# Patient Record
Sex: Female | Born: 1955 | Race: White | Hispanic: No | Marital: Single | Smoking: Former smoker
Health system: Southern US, Community
[De-identification: ages and names within clinical notes are randomized; demographics above are authoritative.]

## PROBLEM LIST (undated history)

## (undated) DIAGNOSIS — I10 Essential (primary) hypertension: Secondary | ICD-10-CM

## (undated) DIAGNOSIS — E669 Obesity, unspecified: Secondary | ICD-10-CM

## (undated) DIAGNOSIS — E119 Type 2 diabetes mellitus without complications: Secondary | ICD-10-CM

## (undated) DIAGNOSIS — E7800 Pure hypercholesterolemia, unspecified: Secondary | ICD-10-CM

## (undated) HISTORY — PX: KNEE SURGERY: SHX244

## (undated) HISTORY — PX: HEMORRHOID SURGERY: SHX153

## (undated) HISTORY — PX: OTHER SURGICAL HISTORY: SHX169

## (undated) HISTORY — PX: ANKLE SURGERY: SHX546

## (undated) HISTORY — PX: APPENDECTOMY: SHX54

---

## 1998-02-24 ENCOUNTER — Ambulatory Visit (HOSPITAL_COMMUNITY): Admission: RE | Admit: 1998-02-24 | Discharge: 1998-02-24 | Payer: Self-pay

## 1998-04-05 ENCOUNTER — Emergency Department (HOSPITAL_COMMUNITY): Admission: EM | Admit: 1998-04-05 | Discharge: 1998-04-05 | Payer: Self-pay | Admitting: Emergency Medicine

## 1998-04-09 ENCOUNTER — Emergency Department (HOSPITAL_COMMUNITY): Admission: EM | Admit: 1998-04-09 | Discharge: 1998-04-10 | Payer: Self-pay | Admitting: Emergency Medicine

## 1998-04-16 ENCOUNTER — Encounter: Admission: RE | Admit: 1998-04-16 | Discharge: 1998-04-16 | Payer: Self-pay | Admitting: Hematology and Oncology

## 1998-04-20 ENCOUNTER — Ambulatory Visit (HOSPITAL_COMMUNITY): Admission: RE | Admit: 1998-04-20 | Discharge: 1998-04-20 | Payer: Self-pay | Admitting: *Deleted

## 1998-04-21 ENCOUNTER — Encounter: Admission: RE | Admit: 1998-04-21 | Discharge: 1998-04-21 | Payer: Self-pay | Admitting: Internal Medicine

## 1998-05-06 ENCOUNTER — Ambulatory Visit (HOSPITAL_COMMUNITY): Admission: RE | Admit: 1998-05-06 | Discharge: 1998-05-06 | Payer: Self-pay | Admitting: *Deleted

## 1998-05-12 ENCOUNTER — Encounter: Admission: RE | Admit: 1998-05-12 | Discharge: 1998-05-12 | Payer: Self-pay | Admitting: Hematology and Oncology

## 1998-05-21 ENCOUNTER — Ambulatory Visit (HOSPITAL_COMMUNITY): Admission: RE | Admit: 1998-05-21 | Discharge: 1998-05-21 | Payer: Self-pay | Admitting: *Deleted

## 1998-05-26 ENCOUNTER — Encounter: Admission: RE | Admit: 1998-05-26 | Discharge: 1998-08-24 | Payer: Self-pay | Admitting: *Deleted

## 1998-05-27 ENCOUNTER — Encounter: Admission: RE | Admit: 1998-05-27 | Discharge: 1998-05-27 | Payer: Self-pay | Admitting: Hematology and Oncology

## 1998-06-10 ENCOUNTER — Encounter: Admission: RE | Admit: 1998-06-10 | Discharge: 1998-06-10 | Payer: Self-pay | Admitting: Hematology and Oncology

## 1998-06-23 ENCOUNTER — Emergency Department (HOSPITAL_COMMUNITY): Admission: EM | Admit: 1998-06-23 | Discharge: 1998-06-23 | Payer: Self-pay | Admitting: Emergency Medicine

## 1998-07-14 ENCOUNTER — Inpatient Hospital Stay (HOSPITAL_COMMUNITY): Admission: EM | Admit: 1998-07-14 | Discharge: 1998-07-17 | Payer: Self-pay | Admitting: Emergency Medicine

## 1998-07-14 ENCOUNTER — Encounter: Payer: Self-pay | Admitting: Emergency Medicine

## 1998-07-15 ENCOUNTER — Encounter: Payer: Self-pay | Admitting: Internal Medicine

## 1998-07-28 ENCOUNTER — Encounter: Admission: RE | Admit: 1998-07-28 | Discharge: 1998-07-28 | Payer: Self-pay | Admitting: Internal Medicine

## 1998-09-05 ENCOUNTER — Ambulatory Visit (HOSPITAL_COMMUNITY): Admission: RE | Admit: 1998-09-05 | Discharge: 1998-09-05 | Payer: Self-pay | Admitting: Internal Medicine

## 1998-09-05 ENCOUNTER — Encounter: Admission: RE | Admit: 1998-09-05 | Discharge: 1998-09-05 | Payer: Self-pay | Admitting: Internal Medicine

## 1998-09-29 ENCOUNTER — Encounter: Admission: RE | Admit: 1998-09-29 | Discharge: 1998-09-29 | Payer: Self-pay | Admitting: Internal Medicine

## 1998-10-13 ENCOUNTER — Ambulatory Visit (HOSPITAL_COMMUNITY): Admission: RE | Admit: 1998-10-13 | Discharge: 1998-10-13 | Payer: Self-pay

## 1998-10-29 ENCOUNTER — Encounter: Admission: RE | Admit: 1998-10-29 | Discharge: 1998-10-29 | Payer: Self-pay | Admitting: Internal Medicine

## 1998-11-17 ENCOUNTER — Encounter: Admission: RE | Admit: 1998-11-17 | Discharge: 1998-11-17 | Payer: Self-pay | Admitting: Internal Medicine

## 1998-12-08 ENCOUNTER — Encounter: Payer: Self-pay | Admitting: Emergency Medicine

## 1998-12-08 ENCOUNTER — Emergency Department (HOSPITAL_COMMUNITY): Admission: EM | Admit: 1998-12-08 | Discharge: 1998-12-08 | Payer: Self-pay | Admitting: Emergency Medicine

## 1998-12-30 ENCOUNTER — Inpatient Hospital Stay (HOSPITAL_COMMUNITY): Admission: EM | Admit: 1998-12-30 | Discharge: 1999-01-01 | Payer: Self-pay | Admitting: Emergency Medicine

## 1998-12-30 ENCOUNTER — Encounter: Payer: Self-pay | Admitting: Emergency Medicine

## 1998-12-31 ENCOUNTER — Encounter: Payer: Self-pay | Admitting: Internal Medicine

## 1999-01-12 ENCOUNTER — Encounter: Admission: RE | Admit: 1999-01-12 | Discharge: 1999-01-12 | Payer: Self-pay | Admitting: Internal Medicine

## 1999-03-24 ENCOUNTER — Encounter: Admission: RE | Admit: 1999-03-24 | Discharge: 1999-03-24 | Payer: Self-pay | Admitting: Internal Medicine

## 1999-05-19 ENCOUNTER — Encounter: Admission: RE | Admit: 1999-05-19 | Discharge: 1999-05-19 | Payer: Self-pay | Admitting: Internal Medicine

## 1999-06-21 ENCOUNTER — Encounter: Payer: Self-pay | Admitting: Emergency Medicine

## 1999-06-21 ENCOUNTER — Emergency Department (HOSPITAL_COMMUNITY): Admission: EM | Admit: 1999-06-21 | Discharge: 1999-06-21 | Payer: Self-pay | Admitting: Emergency Medicine

## 1999-06-24 ENCOUNTER — Encounter: Admission: RE | Admit: 1999-06-24 | Discharge: 1999-06-24 | Payer: Self-pay | Admitting: Internal Medicine

## 1999-07-01 ENCOUNTER — Emergency Department (HOSPITAL_COMMUNITY): Admission: EM | Admit: 1999-07-01 | Discharge: 1999-07-02 | Payer: Self-pay | Admitting: Emergency Medicine

## 1999-07-02 ENCOUNTER — Encounter: Payer: Self-pay | Admitting: Emergency Medicine

## 1999-07-28 ENCOUNTER — Emergency Department (HOSPITAL_COMMUNITY): Admission: EM | Admit: 1999-07-28 | Discharge: 1999-07-28 | Payer: Self-pay | Admitting: Emergency Medicine

## 1999-12-15 ENCOUNTER — Emergency Department (HOSPITAL_COMMUNITY): Admission: EM | Admit: 1999-12-15 | Discharge: 1999-12-15 | Payer: Self-pay

## 1999-12-23 ENCOUNTER — Encounter: Admission: RE | Admit: 1999-12-23 | Discharge: 1999-12-23 | Payer: Self-pay | Admitting: Internal Medicine

## 2000-02-21 ENCOUNTER — Emergency Department (HOSPITAL_COMMUNITY): Admission: EM | Admit: 2000-02-21 | Discharge: 2000-02-21 | Payer: Self-pay | Admitting: Emergency Medicine

## 2000-02-23 ENCOUNTER — Emergency Department (HOSPITAL_COMMUNITY): Admission: EM | Admit: 2000-02-23 | Discharge: 2000-02-23 | Payer: Self-pay | Admitting: *Deleted

## 2000-03-03 ENCOUNTER — Encounter: Admission: RE | Admit: 2000-03-03 | Discharge: 2000-03-03 | Payer: Self-pay | Admitting: Internal Medicine

## 2000-03-17 ENCOUNTER — Encounter: Admission: RE | Admit: 2000-03-17 | Discharge: 2000-03-17 | Payer: Self-pay | Admitting: Hematology and Oncology

## 2000-03-20 ENCOUNTER — Encounter: Payer: Self-pay | Admitting: Emergency Medicine

## 2000-03-20 ENCOUNTER — Inpatient Hospital Stay (HOSPITAL_COMMUNITY): Admission: EM | Admit: 2000-03-20 | Discharge: 2000-03-21 | Payer: Self-pay | Admitting: Emergency Medicine

## 2000-03-23 ENCOUNTER — Encounter: Admission: RE | Admit: 2000-03-23 | Discharge: 2000-03-23 | Payer: Self-pay | Admitting: Internal Medicine

## 2000-03-25 ENCOUNTER — Encounter: Payer: Self-pay | Admitting: *Deleted

## 2000-03-25 ENCOUNTER — Ambulatory Visit (HOSPITAL_COMMUNITY): Admission: RE | Admit: 2000-03-25 | Discharge: 2000-03-25 | Payer: Self-pay | Admitting: *Deleted

## 2000-04-05 ENCOUNTER — Encounter: Admission: RE | Admit: 2000-04-05 | Discharge: 2000-04-05 | Payer: Self-pay | Admitting: Internal Medicine

## 2000-04-12 ENCOUNTER — Emergency Department (HOSPITAL_COMMUNITY): Admission: EM | Admit: 2000-04-12 | Discharge: 2000-04-13 | Payer: Self-pay | Admitting: Emergency Medicine

## 2000-04-15 ENCOUNTER — Encounter: Admission: RE | Admit: 2000-04-15 | Discharge: 2000-04-15 | Payer: Self-pay | Admitting: Internal Medicine

## 2000-05-02 ENCOUNTER — Encounter: Admission: RE | Admit: 2000-05-02 | Discharge: 2000-05-02 | Payer: Self-pay

## 2000-05-07 ENCOUNTER — Inpatient Hospital Stay (HOSPITAL_COMMUNITY): Admission: EM | Admit: 2000-05-07 | Discharge: 2000-05-08 | Payer: Self-pay | Admitting: Emergency Medicine

## 2000-05-07 ENCOUNTER — Encounter: Payer: Self-pay | Admitting: Emergency Medicine

## 2000-05-30 ENCOUNTER — Encounter: Admission: RE | Admit: 2000-05-30 | Discharge: 2000-05-30 | Payer: Self-pay | Admitting: Internal Medicine

## 2000-12-06 ENCOUNTER — Emergency Department (HOSPITAL_COMMUNITY): Admission: EM | Admit: 2000-12-06 | Discharge: 2000-12-06 | Payer: Self-pay | Admitting: Emergency Medicine

## 2000-12-06 ENCOUNTER — Encounter: Payer: Self-pay | Admitting: Emergency Medicine

## 2001-02-28 ENCOUNTER — Emergency Department (HOSPITAL_COMMUNITY): Admission: EM | Admit: 2001-02-28 | Discharge: 2001-02-28 | Payer: Self-pay | Admitting: Unknown Physician Specialty

## 2001-05-10 ENCOUNTER — Encounter: Payer: Self-pay | Admitting: Emergency Medicine

## 2001-05-10 ENCOUNTER — Emergency Department (HOSPITAL_COMMUNITY): Admission: EM | Admit: 2001-05-10 | Discharge: 2001-05-10 | Payer: Self-pay | Admitting: Emergency Medicine

## 2001-06-15 ENCOUNTER — Encounter: Admission: RE | Admit: 2001-06-15 | Discharge: 2001-06-15 | Payer: Self-pay | Admitting: Internal Medicine

## 2001-06-20 ENCOUNTER — Ambulatory Visit: Admission: RE | Admit: 2001-06-20 | Discharge: 2001-06-20 | Payer: Self-pay | Admitting: *Deleted

## 2001-06-23 ENCOUNTER — Encounter: Admission: RE | Admit: 2001-06-23 | Discharge: 2001-06-23 | Payer: Self-pay | Admitting: Internal Medicine

## 2001-07-05 ENCOUNTER — Ambulatory Visit (HOSPITAL_COMMUNITY): Admission: RE | Admit: 2001-07-05 | Discharge: 2001-07-05 | Payer: Self-pay | Admitting: Internal Medicine

## 2001-11-07 ENCOUNTER — Encounter: Admission: RE | Admit: 2001-11-07 | Discharge: 2001-11-07 | Payer: Self-pay

## 2001-12-12 ENCOUNTER — Encounter: Admission: RE | Admit: 2001-12-12 | Discharge: 2001-12-12 | Payer: Self-pay | Admitting: *Deleted

## 2001-12-12 ENCOUNTER — Other Ambulatory Visit: Admission: RE | Admit: 2001-12-12 | Discharge: 2001-12-12 | Payer: Self-pay | Admitting: *Deleted

## 2001-12-13 ENCOUNTER — Encounter: Admission: RE | Admit: 2001-12-13 | Discharge: 2001-12-13 | Payer: Self-pay

## 2002-01-24 ENCOUNTER — Encounter: Admission: RE | Admit: 2002-01-24 | Discharge: 2002-01-24 | Payer: Self-pay | Admitting: Internal Medicine

## 2002-01-29 ENCOUNTER — Encounter: Payer: Self-pay | Admitting: Internal Medicine

## 2002-01-29 ENCOUNTER — Encounter: Admission: RE | Admit: 2002-01-29 | Discharge: 2002-01-29 | Payer: Self-pay | Admitting: *Deleted

## 2002-02-11 ENCOUNTER — Inpatient Hospital Stay (HOSPITAL_COMMUNITY): Admission: EM | Admit: 2002-02-11 | Discharge: 2002-02-13 | Payer: Self-pay | Admitting: Cardiovascular Disease

## 2002-02-11 ENCOUNTER — Encounter: Payer: Self-pay | Admitting: Emergency Medicine

## 2002-02-12 ENCOUNTER — Encounter: Payer: Self-pay | Admitting: Cardiovascular Disease

## 2002-03-20 ENCOUNTER — Encounter: Admission: RE | Admit: 2002-03-20 | Discharge: 2002-03-20 | Payer: Self-pay | Admitting: Internal Medicine

## 2002-03-26 ENCOUNTER — Encounter: Payer: Self-pay | Admitting: Internal Medicine

## 2002-03-26 ENCOUNTER — Ambulatory Visit (HOSPITAL_COMMUNITY): Admission: RE | Admit: 2002-03-26 | Discharge: 2002-03-26 | Payer: Self-pay | Admitting: Internal Medicine

## 2002-04-07 ENCOUNTER — Emergency Department (HOSPITAL_COMMUNITY): Admission: EM | Admit: 2002-04-07 | Discharge: 2002-04-07 | Payer: Self-pay | Admitting: Emergency Medicine

## 2002-04-07 ENCOUNTER — Encounter: Payer: Self-pay | Admitting: Emergency Medicine

## 2002-04-17 ENCOUNTER — Encounter: Admission: RE | Admit: 2002-04-17 | Discharge: 2002-04-17 | Payer: Self-pay | Admitting: Orthopedic Surgery

## 2002-04-17 ENCOUNTER — Encounter: Payer: Self-pay | Admitting: Orthopedic Surgery

## 2002-04-20 ENCOUNTER — Encounter: Admission: RE | Admit: 2002-04-20 | Discharge: 2002-04-20 | Payer: Self-pay | Admitting: Internal Medicine

## 2002-05-11 ENCOUNTER — Ambulatory Visit (HOSPITAL_COMMUNITY): Admission: RE | Admit: 2002-05-11 | Discharge: 2002-05-11 | Payer: Self-pay | Admitting: Internal Medicine

## 2002-05-11 ENCOUNTER — Encounter: Admission: RE | Admit: 2002-05-11 | Discharge: 2002-05-11 | Payer: Self-pay | Admitting: Internal Medicine

## 2002-05-11 ENCOUNTER — Encounter: Payer: Self-pay | Admitting: Internal Medicine

## 2002-05-24 ENCOUNTER — Ambulatory Visit (HOSPITAL_BASED_OUTPATIENT_CLINIC_OR_DEPARTMENT_OTHER): Admission: RE | Admit: 2002-05-24 | Discharge: 2002-05-24 | Payer: Self-pay | Admitting: *Deleted

## 2002-05-30 ENCOUNTER — Emergency Department (HOSPITAL_COMMUNITY): Admission: EM | Admit: 2002-05-30 | Discharge: 2002-05-30 | Payer: Self-pay | Admitting: Emergency Medicine

## 2002-06-01 ENCOUNTER — Encounter: Admission: RE | Admit: 2002-06-01 | Discharge: 2002-06-01 | Payer: Self-pay | Admitting: Internal Medicine

## 2002-06-15 ENCOUNTER — Encounter: Admission: RE | Admit: 2002-06-15 | Discharge: 2002-06-15 | Payer: Self-pay | Admitting: Internal Medicine

## 2002-07-08 ENCOUNTER — Encounter: Payer: Self-pay | Admitting: Emergency Medicine

## 2002-07-08 ENCOUNTER — Emergency Department (HOSPITAL_COMMUNITY): Admission: EM | Admit: 2002-07-08 | Discharge: 2002-07-08 | Payer: Self-pay | Admitting: Emergency Medicine

## 2002-07-27 ENCOUNTER — Ambulatory Visit (HOSPITAL_COMMUNITY): Admission: RE | Admit: 2002-07-27 | Discharge: 2002-07-27 | Payer: Self-pay | Admitting: *Deleted

## 2002-07-27 ENCOUNTER — Encounter: Admission: RE | Admit: 2002-07-27 | Discharge: 2002-07-27 | Payer: Self-pay | Admitting: Internal Medicine

## 2002-07-27 ENCOUNTER — Encounter: Payer: Self-pay | Admitting: *Deleted

## 2002-08-27 ENCOUNTER — Encounter: Admission: RE | Admit: 2002-08-27 | Discharge: 2002-08-27 | Payer: Self-pay | Admitting: Internal Medicine

## 2002-08-27 ENCOUNTER — Ambulatory Visit (HOSPITAL_COMMUNITY): Admission: RE | Admit: 2002-08-27 | Discharge: 2002-08-27 | Payer: Self-pay | Admitting: Internal Medicine

## 2002-09-11 ENCOUNTER — Encounter: Admission: RE | Admit: 2002-09-11 | Discharge: 2002-12-10 | Payer: Self-pay | Admitting: *Deleted

## 2002-09-21 ENCOUNTER — Encounter: Admission: RE | Admit: 2002-09-21 | Discharge: 2002-09-21 | Payer: Self-pay | Admitting: Internal Medicine

## 2002-11-14 ENCOUNTER — Encounter: Admission: RE | Admit: 2002-11-14 | Discharge: 2002-11-14 | Payer: Self-pay | Admitting: Internal Medicine

## 2003-01-23 ENCOUNTER — Encounter: Admission: RE | Admit: 2003-01-23 | Discharge: 2003-01-23 | Payer: Self-pay | Admitting: Internal Medicine

## 2003-04-17 ENCOUNTER — Encounter: Admission: RE | Admit: 2003-04-17 | Discharge: 2003-04-17 | Payer: Self-pay | Admitting: Internal Medicine

## 2007-06-08 ENCOUNTER — Inpatient Hospital Stay (HOSPITAL_COMMUNITY): Admission: EM | Admit: 2007-06-08 | Discharge: 2007-06-09 | Payer: Self-pay | Admitting: Emergency Medicine

## 2007-06-08 ENCOUNTER — Ambulatory Visit: Payer: Self-pay | Admitting: Cardiology

## 2007-06-29 ENCOUNTER — Emergency Department (HOSPITAL_COMMUNITY): Admission: EM | Admit: 2007-06-29 | Discharge: 2007-06-29 | Payer: Self-pay | Admitting: Emergency Medicine

## 2011-03-16 NOTE — Cardiovascular Report (Signed)
NAME:  [name] [name], [name] [name] [name]                  ACCOUNT NO.:  [id number]   MEDICAL RECORD NO.:  [id number]          PATIENT TYPE:  INP   LOCATION:  3731                         FACILITY:  [facility]   PHYSICIAN:  [name] Buckles. [name] [name], MDDATE OF BIRTH:  10/25/1956   DATE OF PROCEDURE:  06/08/2007  DATE OF DISCHARGE:                            CARDIAC CATHETERIZATION   PRIMARY CARE PHYSICIAN:  Dr. Sung [name] [name].   PATIENT IDENTIFICATION:  The patient is a very pleasant, 55 year old  woman with multiple medical problems including hypertension, morbid  obesity, fibromyalgia. She had a cardiac catheterization about 10 years  ago which showed minimal coronary artery disease. She was admitted with  chest pain concerning for unstable angina. EKG was unremarkable. Cardiac  enzymes were normal. She was brought to the catheterization lab.   PROCEDURES PERFORMED:  1. Selective coronary angiography.  2. Left heart catheterization.  3. Left ventriculogram.   DESCRIPTION OF PROCEDURE:  The risks and indications of the procedures  were explained; consent was signed and placed on the chart.   A 5-French arterial sheath was placed in the right femoral artery.  Standard catheters, including a JL4, JR4 and angled pigtail were used  for the procedure. All catheter exchanges were made over wire. There  were no apparent complications.   FINDINGS:  1. Central aortic pressure 123/79 with a mean of 98.  2. LV pressure is 116/11 with an EDP of 31. There was no aortic      stenosis.  3. Left main was normal.  4. LAD was long vessel coursing through the apex. It gave off a large      first diagonal and a large septal perforator. There were minimal      luminal irregularities in the mid section.  5. Left circumflex was made up of a tiny OM1 and a large branching OM2      and a small OM3. There was minor irregularity in the OM3.  6. Right coronary artery was a dominant vessel. It gave off a large      acute marginal which fed the  PDA territory. There was a small      posterior lateral. It was angiographically normal.  7. Left ventriculogram done in the RAO position showed an EF of 60%      with no focal wall motion abnormalities.   ASSESSMENT:  1. Essentially normal coronary arteries.  2. Normal left ventricular function with elevated left ventricular end-      diastolic pressure, consistent with diastolic dysfunction.   PLAN:  This is probably noncardiac chest pain. Will check a D-dimer. I  suspect she will be suitable for discharge home; consider a proton pump  inhibitor for possible reflux.      [name] Buckles. Bensimhon, MD  Electronically Signed     DRB/MEDQ  D:  06/08/2007  T:  06/08/2007  Job:  [id number]   cc:   [name] [name] [name] [name] [name], Dr.

## 2011-03-16 NOTE — Discharge Summary (Signed)
NAME:  [name] [name], [name] [name]                  ACCOUNT NO.:  [id number]   MEDICAL RECORD NO.:  [id number]          PATIENT TYPE:  INP   LOCATION:  3731                         FACILITY:  [facility]   PHYSICIAN:  Bevelyn Buckles. Bensimhon, MDDATE OF BIRTH:  26-Apr-1956   DATE OF ADMISSION:  06/08/2007  DATE OF DISCHARGE:  06/09/2007                               DISCHARGE SUMMARY   PRIMARY CARDIOLOGIST:  Dr. Arvilla Meres.   PRIMARY CARE PHYSICIAN:  Dr. Melven Sartorius at [facility] in [place].   PROCEDURES PERFORMED DURING HOSPITALIZATION:  Cardiac catheterization on  June 08, 2007 by Dr. Arvilla Meres revealing normal LV function with  elevated LV EDP.  Essentially normal coronaries.  Diastolic dysfunction.   DISCHARGE DIAGNOSES:  1. Noncardiac chest pain.  2. History of hypertension.  3. Hypercholesterolemia.  4. Depression.  5. History of asthma.  6. Hypothyroidism.  7. Fibromyalgia.  8. Obstructive sleep apnea, does not use CPAP.   HOSPITAL COURSE:  This is a 55 year old female who was admitted to our  service on June 08, 2007 as a new patient with complaints of chest  tightness, which she rates a 10, describes also with associated  dizziness and diaphoresis after she went to bed.  The chest pain woke  her up and is now feeling tightness and having trouble taking deep  breaths.  The patient tried nebulizer treatments at home and had no  relief.  She called 9-1-1 and her daughter found her lying on the floor,  uncertain if she had a syncopal episode versus lying down.  The patient  was found to have a blood pressure of 190-210 systolically.  She was  given nitroglycerin, aspirin and morphine and transferred to [facility]  Emergency Room.   The patient was seen and examined by Dr. Arvilla Meres in the  emergency room.  The patient was admitted with unstable angina and  started on heparin, nitroglycerin and aspirin.  A cardiac  catheterization was scheduled the day of admission.  The  patient  underwent cardiac catheterization by Dr. Arvilla Meres as described  above.  The patient tolerated the procedure well without evidence of  hematoma, bleeding or infection at the site.  The patient continued to  have some mild chest discomfort, but it was found not to be cardiac in  origin.  Her D-dimer was negative.  She had been ruled out for  myocardial infarction.  The patient was felt this was more  musculoskeletal.  The patient was also started on a proton pump  inhibitor, Protonix 40 mg b.i.d. and advised to return home today and  follow up with her primary care physician.   LABS ON DISCHARGE:  Sodium 141, potassium 4.8, chloride 106, CO2 31,  glucose 103, BUN 11, creatinine 0.77.  Hemoglobin 11.0, hematocrit 32.1,  white blood cell is 5.7, platelets 205.  Cardiac enzymes were found to  be negative with a troponin of 0.01, 0.02 and less than 0.05  respectively.  TSH 4.467.  D-dimer negative at 0.26.  Hemoglobin A1c  6.0.  Chest x-ray revealed mild pulmonary vascular congestion.  EKG  revealed normal sinus rhythm with occasional PVCs and right bundle  branch block noted.   DISCHARGE MEDICATIONS:  1. Protonix 40 mg twice a day.  2. Lipitor 80 mg daily.  3. Metoprolol 100 mg daily.  4. Levothroid 150 mcg daily.  5. Cymbalta 60 mg daily.   ALLERGIES:  NO KNOWN DRUG ALLERGIES.   FOLLOWUP PLANS AND APPOINTMENTS:  1. The patient will follow up with physician assistant with Dr. Arvilla Meres on September 3 at 10:15 a.m.  2. The patient has been given post cardiac catheterization      instructions with particular emphasis on the right groin site for      evidence of hematoma, bleeding, infection or severe pain.  3. The patient will follow with Dr. Melven Sartorius through [facility] in      [place] for continued medical management.   Time spent with the patient, to include physician time, 45 minutes.      Bettey Mare. Lyman Bishop, NP      Bevelyn Buckles. Bensimhon,  MD  Electronically Signed    KML/MEDQ  D:  06/09/2007  T:  06/10/2007  Job:  [id number]   cc:   Melven Sartorius, M.D.

## 2011-03-16 NOTE — H&P (Signed)
NAME:  [name] [name], [name] [name]                  ACCOUNT NO.:  [id number]   MEDICAL RECORD NO.:  [id number]          PATIENT TYPE:  INP   LOCATION:  1823                         FACILITY:  [facility]   PHYSICIAN:  Bevelyn Buckles. Bensimhon, MDDATE OF BIRTH:  October 12, 1956   DATE OF ADMISSION:  06/08/2007  DATE OF DISCHARGE:                              HISTORY & PHYSICAL   CARDIOLOGIST:  New, being seen by Dr. Gala Romney.  The patient states she  saw [facility] approximately 10 years ago.   PRIMARY CARE DOCTOR:  Dr. Sung Amabile, at [facility] in [place], [place]  [place].   HISTORY OF PRESENT ILLNESS:  [name] is a pleasant 55 year old  Caucasian female with known history of coronary artery disease.  The  patient reports she underwent a cardiac catheterization in 1998 here at  [facility] prior to moving to [place].  She reports she was told  she had a 30-40% blockage in one of her vessels.  Does not recall which  one.  She denies any previous interventions.  She states she also had a  stress test at that time and echocardiogram.  She reports that workup  was done secondary to episode of chest discomfort similar to what she is  having today.  She currently lives in [place], [place].  She is  here caring for her grandchildren while her daughter is in the hospital.  She states she has been in her usual state of health until last night.  She had gone to bed  She was ver tired from caring for the  grandchildren.  She states chest discomfort woke her up approximately  around midnight.  She describes it as a heavy tight sensation in her  sternal area.  She was unable to take a deep breath.  She has a history  of asthma, so she tried her nebulizer with absolutely no relief.  She  became very diaphoretic, dizzy, had diarrhea.  She called her daughter  who called 9-1-1.  Apparently, at that point, history is somewhat vague.  Daughter states when she got there, she found her mother leaning into  the door and was unable a new open the door.  When she did get the door  open her mother fell into her arms.  [name] cannot really recall what  happened around that time.  She remembers walking from the bedroom into  the living room.  Does not recall anything after that until EMS had her  on the stretcher.  She states her chest discomfort was a 10 on a scale  of 1-10.  When EMS got there, she was diaphoretic, had an episode of  diarrhea, dizzy.  Her blood pressure was 190-210 systolic.  The patient  was treated with nitroglycerin and aspirin en route with morphine.  She  states her chest discomfort eased off to 6 or 7.  The patient also  states she has forgotten to take her blood pressure medicines for 3  days.  She forgot to bring them with her from [place].  She has  been up here.  EKG showed normal sinus rhythm with a right bundle branch  block.  The patient states she was told in the past she had an abnormal  EKG.  Currently, she is rating her pain around a 5.  She does not have  any nitroglycerin or heparin infusing at this time.   ALLERGIES:  NO KNOWN DRUG ALLERGIES.   MEDICATIONS:  1. Cymbalta.  2. Metoprolol 100.  3. Zantac.  4. Nexium.  5. Synthroid 150 mcg.  6. Statin which is new.  She states she just got the prescription for      it, she does not know what it is.  7. Cymbalta 60 mg daily.  8. The patient states she was previously on aspirin.  However,      approximately 6 months ago, she had an episode of a questionable GI      bleed.  Had a colonoscopy, was told she had diverticulitis.  Her      aspirin was discontinued at that time.   PAST MEDICAL HISTORY:  1. Hypertension.  2. Hypercholesteremia.  3. Depression.  4. Asthma.  5. Hypothyroidism.  6. Fibromyalgia.  7. Rheumatoid arthritis with intolerance to RA medicines.  8. Questionable TIA in the past.  9. Questionable diagnosis of CHF in 1999.  10.Obstructive sleep apnea.  The patient not using her  CPAP yet.  She      states she has not been fitted with it because she has not been      back to the doctor for further evaluation after sleep study.  11.Coronary artery disease supposedly has single vessel disease.  The      patient cannot recall if there were other blockages or not.  Cath      report from 1998 pending follow-up of documentation.  Apparently      had an echo and a stress test also at that time.  12.Hemorrhoid surgery.  13.Tonsillectomy.  14.Appendectomy.  15.Questionable GI bleed/diverticulitis status post colonoscopy      approximately 6 months ago with aspirin discontinued at that time.   SOCIAL HISTORY:  She lives in [place], [place] with her boyfriend.  She is disabled.  She states her disability is secondary to chronic pain  in her lower extremities with unclear diagnosis.  She has adult  children.  Tobacco she quit 15 years ago.  Denies any EtOH, drug or  herbal medication use.  No diet restrictions.  Mother deceased at age 8  secondary to acute CVA.  Father is alive at age 52.  She has been  estranged from him, does not know his medical history.  She has multiple  siblings with known coronary artery disease status post MI status post  stent.  She has a sister with cardiomyopathy awaiting a transplant   REVIEW OF SYSTEMS:  Positive for sweats, headache, chest pain, shortness  of breath, dyspnea on exertion, peripheral edema, questionable syncopal  episode today as stated above, myalgia, arthralgia pain in shoulders and  back secondary to fibromyalgia and episode of diarrhea this a.m.   PHYSICAL EXAMINATION:  VITAL SIGNS:  Temperature 97.4, pulse 60,  respirations 20, blood pressure currently 137/77.  The patient's sat 90,  6% of 2 liters nasal cannula.  She is currently rating her chest pain  around 5 substernal discomfort.  HEENT:  Normocephalic, atraumatic.  Pupils equal, round, reactive to  light.  Mucous membrane is moist.  NECK:  Supple without  lymphadenopathy, no bruits.  No JVD.  CARDIOVASCULAR:  Somewhat distant heart sounds.  Appears to be S1-S2  regular rate and rhythm.  I do not hear any murmurs, rubs or gallops.  LUNGS:  Clear to auscultation with poor inspiratory effort.  SKIN:  Warm and dry.  ABDOMEN:  Soft, nontender, obese, positive bowel sounds.  EXTREMITIES:  Lower extremities without clubbing, cyanosis or edema.  She has possible positive pedals bilaterally.  NEUROLOGICAL:  She is alert and oriented.   STUDIES:  Chest x-ray results are pending.   The EKG done here in the emergency room is not available at this time.  EKG from EMS shows sinus rhythm with a right bundle branch block, left  anterior fascicular block without acute ST or T-wave changes.   LABORATORY DATA:  H&H 11.2 and 32.7, WBC 6.4, platelets 227,000.  Sodium  140, potassium 4.2, chloride 109, BUN and creatinine 18 and 0.7 with  glucose 110.  The patient has had two point of care markers, negative  x2, D-dimer 0.23.  The patient with what appears to be episode of  unstable angina with previous known disease in multiple risk factors.  The patient will be admitted and stabilized with nitroglycerin  anticoagulate with heparin, aspirin.  Continue her beta blocker, statin  therapy and PPI therapy b.i.d. with previous history of diverticulitis.   PLAN:  1. Proceed with cardiac catheterization today the risks and benefits      have been discussed with the patient.  The patient verbally agrees      to proceed with cardiac catheterization.  2. Questionable syncopal episode during chest discomfort today.      Unclear details at this time as no family members are available to      confirm.  No documentation by EMS as to be aware of this.  3. Hypertension.  The patient without her blood pressure medicine x3      days.  4. Morbid obesity.  5. Hypercholesteremia.  6. Obstructive sleep apnea.  7. Hypothyroidism.  8. Fibromyalgia.  9. Questionable history  of TIA  10.Questionable history CHF and  11.Questionable and remote history of GI bleed secondary to      diverticulitis with aspirin being discontinued approximately 6      months ago.  Dr. Nicholes Mango in to examine and assess patient,      agrees with plan of care.      Dorian Pod, ACNP      Bevelyn Buckles. Bensimhon, MD  Electronically Signed    MB/MEDQ  D:  06/08/2007  T:  06/08/2007  Job:  [id number]   cc:   [facility] in [place], [place] Dr. Sung Amabile

## 2011-03-19 NOTE — Discharge Summary (Signed)
[place]  Patient:    [name] [name], [name] [name]                         MRN: [id number] Adm. Date:  [id number] Disc. Date: [id number] Attending:  Benny Lennert Dictator:   Lyndee Leo. Janey Greaser, M.D.                           Discharge Summary  CHIEF COMPLAINT:  Chest pain.  PROBLEM LIST: 1. Coronary artery disease.  Patient had a catheterization on July 16, 1998, with 30% obstruction in the right coronary artery, ejection fraction    of 50%, and normal left ventricular function at that time. 2. Hypertension. 3. Obesity. 4. Hyperlipidemia. 5. Hypothyroidism. 6. Status post bilateral tubal ligation, appendectomy, and hemorrhoidectomy.  HISTORY OF PRESENT ILLNESS:  A 55 year old white female who presents with chest pain and heart racing, and complaints of retrosternal pain.  Pain started at 2000 hours with light chest pain and it gradually increased, positive nausea, no vomiting, positive shortness of breath with the chest pain.  Chest pain came with rest and occurs at night, no radiation.  Patient did have complaints and some history of dyspnea on exertion for the last year. She states the chest pain and tightness comes off and on.  Patient also states she gets hot flashes with the chest pain.  She did have some diarrhea today. She states the chest pain is 9/10 and currently 7/10.  Patient did not take nitroglycerin.  MEDICATIONS: 1. Zocor 20 mg p.o. q.d. 2. Claritin 10 mg p.o. q.d. 3. Synthroid 200 mcg p.o. q.d. which was recently increased from 150.  ALLERGIES:  No known drug allergies.  FAMILY HISTORY:  Had a mother die of cerebrovascular accident and also who had breast cancer.  Father is deceased with cancer of unknown type.  Patient has a sister with a cardiomyopathy and is a transplant candidate.  SOCIAL HISTORY:  Positive smoker, greater than 60-pack year.  Denies any alcohol or drug use.  Stopped smoking 10 years ago.  Lives in  [place] with her boyfriend.  PHYSICAL EXAMINATION:  VITAL SIGNS:  Temperature 98.6, pulse 89, respirations 20, 98% on room air.  GENERAL:  Sitting up in bed, full sentences, no acute distress.  CARDIOVASCULAR:  Normal S1, S2.  No murmurs, rubs, or gallops.  RESPIRATORY:  Clear to auscultation bilaterally.  No wheezes, no crackles.  ABDOMEN:  Soft, nontender, nondistended, with positive bowel sounds.  EXTREMITIES:  With 2+ edema bilaterally in the lower extremities.  No clubbing or cyanosis.  LABORATORY DATA:  EKG reveals a right bundle branch block, left anterior fascicular block.  There is no change in her EKG from 2000 and 1999.  Patient also had no ST changes from old labs.  Sodium 142, potassium 3.6, chloride 106, CO2 22, BUN 16, glucose 152, white cells 7.0, hemoglobin 13, hematocrit 39, platelets 205.  The pH was 7.48, PCO2 28, bicarbonate 21.  CK 73, total protein 6.8, MB 0.6.  Troponin less than 0.03.  Albumin 3.5, AST 71, ALT 96, alkaline phosphatase 67, total bilirubin 0.5.  Chest x-ray revealed cardiomegaly, no active disease, no changes from old.  ASSESSMENT:  A 55 year old white female with previous hospitalizations in March, 2000 and September, 1999 for similar complaint.  Patient ruled out on both admissions from cardiac standpoint.  PLAN:  Admit and rule out for cardiac,  check TSH.  HOSPITAL COURSE:  Patients enzymes, creatinine kinase initially 73, then 66, then 70.  MB initial 0.6, then 0.5, then 0.5.  Troponin remained less than 0.03.  TSH was 1.416.  Patient ruled out cardiac-wise, would suggest an evaluation for a possible anxiety component as an outpatient.  For patients obesity, patient would benefit from a rehab program.  Patient with symptoms of sleep apnea and will obtain an outpatient sleep study.  Physical therapy was consulted and agreed with possible outpatient rehabilitation program.  Patient was discharged on Mar 21, 2000, and will be  followed by her primary doctor, Dr. ______, in the [facility] clinic. DD:  04/14/00 TD:  04/19/00 Job: [id number]

## 2011-03-19 NOTE — Discharge Summary (Signed)
[place]  Patient:    [name] [name], [name] [name]                         MRN: [id number] Adm. Date:  [id number] Disc. Date: [id number] Attending:  Virgina Evener Dictator:   Halford Decamp Delanna Ahmadi, R.N., N.P. CC:         [facility]   Discharge Summary  HISTORY OF PRESENT ILLNESS AND HOSPITAL COURSE:  [name] is a 55 year old white female with prior medical history of hypertension, hypercholesterolemia, who was initially admitted by Dr. Sherryl Manges, prior to his knowledge that she had been seen at [facility].  She came into the hospital with a chief complaint of substernal chest pain associated with positive shortness of breath, pleuritic focal left chest pain, positive diaphoresis.  In the ER she was given nitroglycerin with improvement.  She continued to have some residual discomfort.  She also complains of palpitations associated with shortness of breath; however, these are not prolonged.  She was admitted to rule out MI.  Old pathology report was obtained from July 16, 1998, at which time she had an essentially normal course.  She had no MR.  Her EF was 50%.  She had only minimal 30% lesion in her proximal RCA.  On May 08, 2000, she was seen by Dr. Alanda Amass, who thought her symptoms were vague and atypical, more musculoskeletal in type.  She had negative CK-MBs.  She had a CT scan for pulmonary embolus, which was also negative.  She had a negative troponin.  Her EKG showed no change.  She has a right bundle branch block and left axis deviation.  Her rhythm was stable.  It was decided that she should undergo outpatient Cardiolite, and she will follow up with her outpatient primary medical physician.  He also decided to add Protonix.  LABORATORY DATA:  Hemoglobin 12.3, hematocrit 38.1, WBC 6.8, platelets 239. Sodium 141, potassium 3.9, BUN 19, creatinine 0.7, AST of 64, ALT was 82. Glycosylated  hemoglobin was 6.4.  CK-MBs were negative x 3.  Troponin negative x 3.  Total cholesterol 148, triglycerides 128, HDL was 30, and LDL was 92. ______ was negative.  DISCHARGE MEDICATIONS: 1. Protonix 40 mg 1 a day. 2. Darvocet-N 100 1 every day. 3. Zocor 20 mg 1 a day. 4. Levothroid 200 mcg every day. 5. Hydrochlorothiazide 25 mg every day. 6. Cytomel 25 mcg 1/2 tablet every day.  DISCHARGE DIAGNOSES: 1. Chest pain, unknown etiology, musculoskeletal versus cardiac versus    gastrointestinal. 2. Palpitations. 3. Hiatal hernia. 4. Obstructive sleep apnea. 5. Hypothyroidism. 6. Hyperlipidemia. 7. Obesity. DD:  09/01/00 TD:  09/01/00 Job: [id number]

## 2011-08-16 LAB — CBC
HCT: 32.1 — ABNORMAL LOW
HCT: 32.7 — ABNORMAL LOW
Hemoglobin: 11 — ABNORMAL LOW
Platelets: 227
RBC: 3.78 — ABNORMAL LOW
RBC: 3.9
RDW: 15.6 — ABNORMAL HIGH
WBC: 6.4

## 2011-08-16 LAB — URINALYSIS, MICROSCOPIC ONLY
Glucose, UA: NEGATIVE
Leukocytes, UA: NEGATIVE
pH: 6

## 2011-08-16 LAB — CARDIAC PANEL(CRET KIN+CKTOT+MB+TROPI)
CK, MB: 0.9
Relative Index: INVALID
Total CK: 69
Total CK: 71
Troponin I: 0.01

## 2011-08-16 LAB — APTT: aPTT: 63 — ABNORMAL HIGH

## 2011-08-16 LAB — TROPONIN I: Troponin I: 0.02

## 2011-08-16 LAB — I-STAT 8, (EC8 V) (CONVERTED LAB)
BUN: 18
Bicarbonate: 24.9 — ABNORMAL HIGH
Chloride: 109
pCO2, Ven: 42.1 — ABNORMAL LOW
pH, Ven: 7.38 — ABNORMAL HIGH

## 2011-08-16 LAB — TSH: TSH: 4.467

## 2011-08-16 LAB — BASIC METABOLIC PANEL
CO2: 31
Calcium: 9
GFR calc Af Amer: >60
GFR calc non Af Amer: >60
Glucose, Bld: 103 — ABNORMAL HIGH
Potassium: 4.8
Sodium: 141

## 2011-08-16 LAB — POCT CARDIAC MARKERS
CKMB, poc: <1 — ABNORMAL LOW
CKMB, poc: <1 — ABNORMAL LOW
Myoglobin, poc: 47.4
Myoglobin, poc: 52.7
Operator id: 277751
Troponin i, poc: <0.05
Troponin i, poc: <0.05

## 2011-08-16 LAB — HEPARIN LEVEL (UNFRACTIONATED): Heparin Unfractionated: 0.45

## 2011-08-16 LAB — LIPID PANEL
LDL Cholesterol: 115 — ABNORMAL HIGH
Triglycerides: 318 — ABNORMAL HIGH

## 2011-08-16 LAB — D-DIMER, QUANTITATIVE: D-Dimer, Quant: 0.23

## 2011-08-16 LAB — HEMOGLOBIN A1C: Mean Plasma Glucose: 136

## 2011-08-16 LAB — URINE CULTURE

## 2013-10-20 ENCOUNTER — Emergency Department (HOSPITAL_COMMUNITY)
Admission: EM | Admit: 2013-10-20 | Discharge: 2013-10-20 | Disposition: A | Discharge status: Home / Self Care | Payer: Medicare Other | Attending: Emergency Medicine | Admitting: Emergency Medicine

## 2013-10-20 ENCOUNTER — Encounter (HOSPITAL_COMMUNITY): Payer: Self-pay | Admitting: Emergency Medicine

## 2013-10-20 ENCOUNTER — Emergency Department (HOSPITAL_COMMUNITY): Payer: Medicare Other

## 2013-10-20 DIAGNOSIS — R0602 Shortness of breath: Secondary | ICD-10-CM | POA: Insufficient documentation

## 2013-10-20 DIAGNOSIS — E7800 Pure hypercholesterolemia, unspecified: Secondary | ICD-10-CM | POA: Insufficient documentation

## 2013-10-20 DIAGNOSIS — I10 Essential (primary) hypertension: Secondary | ICD-10-CM | POA: Insufficient documentation

## 2013-10-20 DIAGNOSIS — E119 Type 2 diabetes mellitus without complications: Secondary | ICD-10-CM | POA: Insufficient documentation

## 2013-10-20 DIAGNOSIS — J019 Acute sinusitis, unspecified: Secondary | ICD-10-CM | POA: Insufficient documentation

## 2013-10-20 DIAGNOSIS — R062 Wheezing: Secondary | ICD-10-CM | POA: Insufficient documentation

## 2013-10-20 DIAGNOSIS — E669 Obesity, unspecified: Secondary | ICD-10-CM | POA: Insufficient documentation

## 2013-10-20 DIAGNOSIS — J329 Chronic sinusitis, unspecified: Secondary | ICD-10-CM

## 2013-10-20 DIAGNOSIS — Z79899 Other long term (current) drug therapy: Secondary | ICD-10-CM | POA: Insufficient documentation

## 2013-10-20 DIAGNOSIS — Y929 Unspecified place or not applicable: Secondary | ICD-10-CM | POA: Insufficient documentation

## 2013-10-20 DIAGNOSIS — Y9301 Activity, walking, marching and hiking: Secondary | ICD-10-CM | POA: Insufficient documentation

## 2013-10-20 DIAGNOSIS — R51 Headache: Secondary | ICD-10-CM | POA: Insufficient documentation

## 2013-10-20 DIAGNOSIS — J209 Acute bronchitis, unspecified: Secondary | ICD-10-CM | POA: Insufficient documentation

## 2013-10-20 DIAGNOSIS — Z87891 Personal history of nicotine dependence: Secondary | ICD-10-CM | POA: Insufficient documentation

## 2013-10-20 DIAGNOSIS — J40 Bronchitis, not specified as acute or chronic: Secondary | ICD-10-CM

## 2013-10-20 DIAGNOSIS — S93409A Sprain of unspecified ligament of unspecified ankle, initial encounter: Secondary | ICD-10-CM | POA: Insufficient documentation

## 2013-10-20 DIAGNOSIS — W010XXA Fall on same level from slipping, tripping and stumbling without subsequent striking against object, initial encounter: Secondary | ICD-10-CM | POA: Insufficient documentation

## 2013-10-20 HISTORY — DX: Type 2 diabetes mellitus without complications: E11.9

## 2013-10-20 HISTORY — DX: Obesity, unspecified: E66.9

## 2013-10-20 HISTORY — DX: Pure hypercholesterolemia, unspecified: E78.00

## 2013-10-20 HISTORY — DX: Essential (primary) hypertension: I10

## 2013-10-20 MED ORDER — ALBUTEROL SULFATE HFA 108 (90 BASE) MCG/ACT IN AERS
2.0000 | INHALATION_SPRAY | Freq: Once | RESPIRATORY_TRACT | Status: AC
  Administered 2013-10-20: 2 via RESPIRATORY_TRACT
  Filled 2013-10-20: qty 6.7

## 2013-10-20 MED ORDER — HYDROCODONE-ACETAMINOPHEN 5-325 MG PO TABS
1.0000 | ORAL_TABLET | Freq: Once | ORAL | Status: AC
  Administered 2013-10-20: 1 via ORAL
  Filled 2013-10-20: qty 1

## 2013-10-20 MED ORDER — AMOXICILLIN 500 MG PO CAPS: 500.0000 mg | ORAL_CAPSULE | Freq: Three times a day (TID) | ORAL | Status: AC

## 2013-10-20 MED ORDER — HYDROCODONE-ACETAMINOPHEN 5-325 MG PO TABS: 1.0000 | ORAL_TABLET | Freq: Four times a day (QID) | ORAL | Status: DC | PRN

## 2013-10-20 NOTE — ED Notes (Signed)
Pt. reports productive cough / chest congestion for 1 week , pt. also stated injury to left ankle with pain / swelling after she tripped at home today .

## 2013-10-20 NOTE — ED Notes (Signed)
Ice pack applied to left ankle.

## 2013-10-20 NOTE — ED Provider Notes (Signed)
CSN: [id number]     Arrival date & time 10/20/13  2038 History  This chart was scribed for non-physician practitioner, Kerrie Buffalo, FNP,working with Shelda Jakes, MD, by Karle Plumber, ED Scribe.  This patient was seen in room TR08C/TR08C and the patient's care was started at 9:49 PM.  Chief Complaint  Patient presents with  . Cough  . Ankle Pain   The history is provided by the patient. No language interpreter was used.   HPI Comments:  [name] [name] is a 57 y.o. female who presents to the Emergency Department complaining of a slightly productive cough of thick, green phlegm that has been going on for approximately one week. She reports associated headache secondary to coughing. She states she has recently been treated with a Z-Pak for a left ear infection about two weeks ago. She states she has a mildly scratchy, sore throat. She reports she has not smoked in 25 years. She also complains of a left ankle injury with associated bruising and swelling secondary to tripping and falling earlier today. She states it is painful to bear weight.    Past Medical History  Diagnosis Date  . Hypertension   . Diabetes mellitus without complication   . Hypercholesteremia   . Obese    Past Surgical History  Procedure Laterality Date  . Ankle surgery    . Knee surgery    . Hemorrhoid surgery    . Appendectomy    . Tubal liogation      No family history on file. History  Substance Use Topics  . Smoking status: Former Games developer  . Smokeless tobacco: Not on file  . Alcohol Use: No   OB History   Grav Para Term Preterm Abortions TAB SAB Ect Mult Living                 Review of Systems  Constitutional: Negative for fever and chills.  HENT: Positive for congestion and sore throat. Negative for facial swelling.   Respiratory: Positive for cough and wheezing.   Gastrointestinal: Negative for nausea and vomiting.  Musculoskeletal: Positive for arthralgias and joint swelling (left ankle).     Allergies  Review of patient's allergies indicates no known allergies.  Home Medications  No current outpatient prescriptions on file. Triage Vitals: BP 139/66  Pulse 87  Temp(Src) 97.8 F (36.6 C) (Oral)  Resp 20  SpO2 98% Physical Exam  Nursing note and vitals reviewed. Constitutional: She is oriented to person, place, and time. She appears well-developed and well-nourished.  HENT:  Head: Normocephalic and atraumatic.  Right Ear: Hearing, tympanic membrane, external ear and ear canal normal.  Left Ear: Hearing, tympanic membrane, external ear and ear canal normal.  Mouth/Throat: Uvula is midline, oropharynx is clear and moist and mucous membranes are normal. No posterior oropharyngeal edema or posterior oropharyngeal erythema.  Tenderness over maxillary sinuses.   Eyes: Conjunctivae and EOM are normal. Pupils are equal, round, and reactive to light.  Neck: Normal range of motion.  Cardiovascular: Normal rate, regular rhythm and normal heart sounds.  Exam reveals no gallop and no friction rub.   No murmur heard. Pedal pulse present. Adequate circulation.   Pulmonary/Chest: Effort normal. She has wheezes.  Expiratory wheezes on the right side. Cough with bronchospasm.  Abdominal: Soft. There is no tenderness.  Musculoskeletal: Normal range of motion.  Left foot swollen on dorsal aspect that extends to lateral aspect of ankle. Tenderness on lateral aspect of foot. Achilles tendon has minimal tenderness. No  deformity palpated. No injury noted to plantar aspect of left foot.    Neurological: She is alert and oriented to person, place, and time.  Good strength of left foot.   Skin: Skin is warm and dry.  Skin intact. Ecchymosis noted on lateral aspect of foot and second toe.   Psychiatric: She has a normal mood and affect. Her behavior is normal.    ED Course  Procedures (including critical care time) DIAGNOSTIC STUDIES: Oxygen Saturation is 98% on RA, normal by my  interpretation.   COORDINATION OF CARE: 9:58 PM- Will prescribe Amoxicillin for sinus infection. Will X-Ray left foot and give pt Vicodin in ED for pain. Pt verbalizes understanding and agrees to plan.  Medications - No data to display  Labs Review Labs Reviewed - No data to display Imaging Review Dg Chest 2 View  10/20/2013   CLINICAL DATA:  Hypertension, cough, shortness of Breath  EXAM: CHEST - 2 VIEW  COMPARISON:  06/08/2007  FINDINGS: Heart size upper limits normal. Improvement in central pulmonary vascular congestion. No focal infiltrate or overt edema. No effusion. Regional bones unremarkable.  IMPRESSION: No acute cardiopulmonary disease.   Electronically Signed   By: Oley Balm M.D.   On: 10/20/2013 21:43   Dg Ankle Complete Left  10/20/2013   CLINICAL DATA:  Pain post tripping  .  EXAM: LEFT ANKLE COMPLETE - 3+ VIEW  COMPARISON:  None.  FINDINGS: Calcaneal spurs at the plantar aponeurosis and Achilles tendon insertions.  There is narrowing of the articular cartilage in the tibiotalar joint with irregularity of the subchondral cortex of the posterior talar dome and distal tibia. Corticated ossicles project inferior to the medial malleolus. No acute fracture. No dislocation. Normal alignment. Normal mineralization elsewhere.  IMPRESSION: 1. Negative for fracture or other acute bone abnormality. 2. Tibiotalar degenerative changes   and calcaneal spurs.   Electronically Signed   By: Oley Balm M.D.   On: 10/20/2013 21:46   57 y.o. female with sinusitis and bronchitis and left ankle sprain. Will treat sinusitis with antibiotics and give Albuterol Inhaler for bronchitis symptoms. Placed in splint for ankle sprain and pain management. She will elevate the ankle, apply ice and rest. She will use crutches for ambulation. She will return as needed for any problems.  MDM  I personally performed the services described in this documentation, which was scribed in my presence. The recorded  information has been reviewed and is accurate.     [address] [place], [place] 10/21/13 [phone]

## 2013-10-20 NOTE — ED Notes (Signed)
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

## 2013-10-20 NOTE — Progress Notes (Signed)
Orthopedic Tech Progress Note Patient Details:  [name] [name] Jul 23, 1956 [id number]  Ortho Devices Type of Ortho Device: Ace wrap;Watson Jones splint;Crutches Ortho Device/Splint Location: lle Ortho Device/Splint Interventions: Application   [name] 10/20/2013, 10:47 PM

## 2013-10-24 NOTE — ED Provider Notes (Signed)
Medical screening examination/treatment/procedure(s) were performed by non-physician practitioner and as supervising physician I was immediately available for consultation/collaboration.  EKG Interpretation   None         Shelda Jakes, MD 10/24/13 [phone]

## 2015-04-29 ENCOUNTER — Emergency Department (HOSPITAL_COMMUNITY)
Admission: EM | Admit: 2015-04-29 | Discharge: 2015-04-29 | Disposition: A | Discharge status: Home / Self Care | Payer: Medicare Other | Attending: Emergency Medicine | Admitting: Emergency Medicine

## 2015-04-29 ENCOUNTER — Encounter (HOSPITAL_COMMUNITY): Payer: Self-pay

## 2015-04-29 DIAGNOSIS — E119 Type 2 diabetes mellitus without complications: Secondary | ICD-10-CM | POA: Diagnosis not present

## 2015-04-29 DIAGNOSIS — M109 Gout, unspecified: Secondary | ICD-10-CM | POA: Insufficient documentation

## 2015-04-29 DIAGNOSIS — I10 Essential (primary) hypertension: Secondary | ICD-10-CM | POA: Diagnosis not present

## 2015-04-29 DIAGNOSIS — Z91040 Latex allergy status: Secondary | ICD-10-CM | POA: Diagnosis not present

## 2015-04-29 DIAGNOSIS — Z79899 Other long term (current) drug therapy: Secondary | ICD-10-CM | POA: Diagnosis not present

## 2015-04-29 DIAGNOSIS — E669 Obesity, unspecified: Secondary | ICD-10-CM | POA: Insufficient documentation

## 2015-04-29 DIAGNOSIS — Z87891 Personal history of nicotine dependence: Secondary | ICD-10-CM | POA: Insufficient documentation

## 2015-04-29 DIAGNOSIS — E780 Pure hypercholesterolemia: Secondary | ICD-10-CM | POA: Insufficient documentation

## 2015-04-29 DIAGNOSIS — M79671 Pain in right foot: Secondary | ICD-10-CM | POA: Diagnosis present

## 2015-04-29 MED ORDER — HYDROCODONE-ACETAMINOPHEN 5-325 MG PO TABS: 1.0000 | ORAL_TABLET | Freq: Four times a day (QID) | ORAL | Status: DC | PRN

## 2015-04-29 MED ORDER — HYDROCODONE-ACETAMINOPHEN 5-325 MG PO TABS
1.0000 | ORAL_TABLET | Freq: Once | ORAL | Status: AC
  Administered 2015-04-29: 1 via ORAL
  Filled 2015-04-29: qty 1

## 2015-04-29 MED ORDER — METHYLPREDNISOLONE 4 MG PO TBPK: ORAL_TABLET | ORAL | Status: AC

## 2015-04-29 NOTE — ED Provider Notes (Signed)
CSN: [id number]     Arrival date & time 04/29/15  0449 History   First MD Initiated Contact with Patient 04/29/15 [phone]     Chief Complaint  Patient presents with  . Foot Pain     (Consider location/radiation/quality/duration/timing/severity/associated sxs/prior Treatment) HPI  This is a 59 year old female with no past history of gout. She is here with severe pain in the base of her right great toe that began yesterday afternoon. The onset was gradual and the pain is now preventing her from sleeping. It is worse with palpation or movement and there is associated erythema of that joint. Ambulation is also difficult due to the pain.  Past Medical History  Diagnosis Date  . Hypertension   . Diabetes mellitus without complication   . Hypercholesteremia   . Obese    Past Surgical History  Procedure Laterality Date  . Ankle surgery    . Knee surgery    . Hemorrhoid surgery    . Appendectomy    . Tubal liogation      No family history on file. History  Substance Use Topics  . Smoking status: Former Games developer  . Smokeless tobacco: Not on file  . Alcohol Use: No   OB History    No data available     Review of Systems  All other systems reviewed and are negative.   Allergies  Niaspan and Latex  Home Medications   Prior to Admission medications   Medication Sig Start Date End Date Taking? Authorizing Provider  amoxicillin (AMOXIL) 500 MG capsule Take 1 capsule (500 mg total) by mouth 3 (three) times daily. 10/20/13   Hope Orlene Och, NP  esomeprazole (NEXIUM) 40 MG capsule Take 40 mg by mouth daily.     Historical Provider, MD  HYDROcodone-acetaminophen (NORCO) 5-325 MG per tablet Take 1 tablet by mouth every 6 (six) hours as needed for moderate pain. 10/20/13   Hope Orlene Och, NP  levothyroxine (SYNTHROID, LEVOTHROID) 137 MCG tablet Take 137 mcg by mouth daily before breakfast.  09/17/13   Historical Provider, MD  metFORMIN (GLUCOPHAGE) 500 MG tablet Take 500 mg by mouth daily with  breakfast.  09/17/13   Historical Provider, MD  metoprolol succinate (TOPROL-XL) 25 MG 24 hr tablet Take 25 mg by mouth daily.  09/17/13   Historical Provider, MD  PARoxetine (PAXIL) 40 MG tablet Take 40 mg by mouth daily.  09/17/13   Historical Provider, MD  ranitidine (ZANTAC) 150 MG tablet Take 150 mg by mouth daily.  08/16/13   Historical Provider, MD  simvastatin (ZOCOR) 40 MG tablet Take 40 mg by mouth daily.  09/17/13   Historical Provider, MD  valsartan-hydrochlorothiazide (DIOVAN-HCT) 160-25 MG per tablet Take 1 tablet by mouth daily.  09/17/13   Historical Provider, MD   BP 117/70 mmHg  Pulse 59  Temp(Src) 97.5 F (36.4 C)  Resp 18  Ht  (1.702 m)  SpO2 94%   Physical Exam  General: Well-developed, well-nourished female in no acute distress; appearance consistent with age of record HENT: normocephalic; atraumatic Eyes: Normal appearance Neck: supple Heart: regular rate and rhythm Lungs: Normal respiratory effort and excursion Abdomen: soft; nondistended Extremities: No deformity; tenderness, mild edema, erythema and mild warmth of right first metatarsophalangeal joint; decreased range of motion of right great toe due to pain Neurologic: Awake, alert and oriented; motor function intact in all extremities and symmetric; no facial droop Skin: Warm and dry Psychiatric: Normal mood and affect    ED Course  Procedures (  including critical care time)   MDM  Examination is consistent with gout. The patient states she tolerates steroids better than NSAIDs. She was advised to discuss continued hydrochlorothiazide use with her physician; she is visiting here from [place].   Paula LibraJohn Gaylon Bentz, MD 04/29/15 [phone]

## 2015-04-29 NOTE — Discharge Instructions (Signed)

## 2015-04-29 NOTE — ED Notes (Signed)
Patient reports sudden onset right foot pain yesterday afternoon.  Denies injury/insect bite.  Area medial foot is tender to touch and pain has increased.

## 2015-12-04 ENCOUNTER — Emergency Department (HOSPITAL_COMMUNITY): Payer: Medicare Other

## 2015-12-04 ENCOUNTER — Emergency Department (HOSPITAL_COMMUNITY)
Admission: EM | Admit: 2015-12-04 | Discharge: 2015-12-05 | Disposition: A | Discharge status: Home / Self Care | Payer: Medicare Other | Attending: Emergency Medicine | Admitting: Emergency Medicine

## 2015-12-04 ENCOUNTER — Encounter (HOSPITAL_COMMUNITY): Payer: Self-pay | Admitting: Emergency Medicine

## 2015-12-04 DIAGNOSIS — W548XXA Other contact with dog, initial encounter: Secondary | ICD-10-CM | POA: Insufficient documentation

## 2015-12-04 DIAGNOSIS — Y9389 Activity, other specified: Secondary | ICD-10-CM | POA: Insufficient documentation

## 2015-12-04 DIAGNOSIS — Y998 Other external cause status: Secondary | ICD-10-CM | POA: Insufficient documentation

## 2015-12-04 DIAGNOSIS — Z91040 Latex allergy status: Secondary | ICD-10-CM | POA: Diagnosis not present

## 2015-12-04 DIAGNOSIS — S0093XA Contusion of unspecified part of head, initial encounter: Secondary | ICD-10-CM

## 2015-12-04 DIAGNOSIS — E669 Obesity, unspecified: Secondary | ICD-10-CM | POA: Diagnosis not present

## 2015-12-04 DIAGNOSIS — E119 Type 2 diabetes mellitus without complications: Secondary | ICD-10-CM | POA: Diagnosis not present

## 2015-12-04 DIAGNOSIS — I10 Essential (primary) hypertension: Secondary | ICD-10-CM | POA: Diagnosis not present

## 2015-12-04 DIAGNOSIS — Z79899 Other long term (current) drug therapy: Secondary | ICD-10-CM | POA: Insufficient documentation

## 2015-12-04 DIAGNOSIS — S0083XA Contusion of other part of head, initial encounter: Secondary | ICD-10-CM | POA: Insufficient documentation

## 2015-12-04 DIAGNOSIS — Y9289 Other specified places as the place of occurrence of the external cause: Secondary | ICD-10-CM | POA: Diagnosis not present

## 2015-12-04 DIAGNOSIS — E785 Hyperlipidemia, unspecified: Secondary | ICD-10-CM | POA: Diagnosis not present

## 2015-12-04 DIAGNOSIS — S6991XA Unspecified injury of right wrist, hand and finger(s), initial encounter: Secondary | ICD-10-CM | POA: Insufficient documentation

## 2015-12-04 DIAGNOSIS — Z87891 Personal history of nicotine dependence: Secondary | ICD-10-CM | POA: Diagnosis not present

## 2015-12-04 DIAGNOSIS — Z7984 Long term (current) use of oral hypoglycemic drugs: Secondary | ICD-10-CM | POA: Insufficient documentation

## 2015-12-04 DIAGNOSIS — S52512A Displaced fracture of left radial styloid process, initial encounter for closed fracture: Secondary | ICD-10-CM

## 2015-12-04 DIAGNOSIS — S6992XA Unspecified injury of left wrist, hand and finger(s), initial encounter: Secondary | ICD-10-CM | POA: Diagnosis present

## 2015-12-04 NOTE — ED Notes (Signed)
Patient had her dog on a leash and the dog pulled her down her stairs. Patient stated she hit her head on the concrete and injured her hand and wrist on the left side.

## 2015-12-04 NOTE — ED Notes (Signed)
Patient transported to X-ray 

## 2015-12-05 ENCOUNTER — Emergency Department (HOSPITAL_COMMUNITY): Payer: Medicare Other

## 2015-12-05 DIAGNOSIS — S52512A Displaced fracture of left radial styloid process, initial encounter for closed fracture: Secondary | ICD-10-CM | POA: Diagnosis not present

## 2015-12-05 MED ORDER — OXYCODONE-ACETAMINOPHEN 5-325 MG PO TABS
2.0000 | ORAL_TABLET | Freq: Once | ORAL | Status: AC
  Administered 2015-12-05: 2 via ORAL
  Filled 2015-12-05: qty 2

## 2015-12-05 MED ORDER — HYDROCODONE-ACETAMINOPHEN 5-325 MG PO TABS: 2.0000 | ORAL_TABLET | ORAL | Status: AC | PRN

## 2015-12-05 NOTE — ED Provider Notes (Signed)
CSN: [id number]     Arrival date & time 12/04/15  2303 History   First MD Initiated Contact with Patient 12/04/15 2332     Chief Complaint  Patient presents with  . Wrist Pain    left  . Hand Pain    left  . Fall     (Consider location/radiation/quality/duration/timing/severity/associated sxs/prior Treatment) Patient is a 60 y.o. female presenting with wrist pain, hand pain, and fall. The history is provided by the patient. No language interpreter was used.  Wrist Pain This is a new problem. The current episode started today. The problem occurs constantly. The problem has been unchanged. Associated symptoms include joint swelling and myalgias. Pertinent negatives include no abdominal pain or vomiting. Nothing aggravates the symptoms. She has tried nothing for the symptoms. The treatment provided moderate relief.  Hand Pain Associated symptoms include joint swelling and myalgias. Pertinent negatives include no abdominal pain or vomiting.  Fall This is a new problem. The current episode started today. The problem occurs constantly. Associated symptoms include joint swelling and myalgias. Pertinent negatives include no abdominal pain or vomiting. Nothing aggravates the symptoms. She has tried nothing for the symptoms. The treatment provided no relief.  Pt reports she was walking her daughter's dog and dog lunged pulling her down 5 steps.  Pt hit the back of her head.  Pt may have lost conciousness.  Past Medical History  Diagnosis Date  . Hypertension   . Diabetes mellitus without complication (HCC)   . Hypercholesteremia   . Obese    Past Surgical History  Procedure Laterality Date  . Ankle surgery    . Knee surgery    . Hemorrhoid surgery    . Appendectomy    . Tubal liogation      History reviewed. No pertinent family history. Social History  Substance Use Topics  . Smoking status: Former Games developer  . Smokeless tobacco: None  . Alcohol Use: No   OB History    No data  available     Review of Systems  Gastrointestinal: Negative for vomiting and abdominal pain.  Musculoskeletal: Positive for myalgias and joint swelling.  All other systems reviewed and are negative.     Allergies  Niaspan and Latex  Home Medications   Prior to Admission medications   Medication Sig Start Date End Date Taking? Authorizing Provider  INVOKANA 300 MG TABS tablet Take 1 tablet by mouth daily. 11/13/15  Yes Historical Provider, MD  levothyroxine (SYNTHROID, LEVOTHROID) 137 MCG tablet Take 137 mcg by mouth daily before breakfast.  09/17/13  Yes Historical Provider, MD  metFORMIN (GLUCOPHAGE) 500 MG tablet Take 500 mg by mouth daily with breakfast.  09/17/13  Yes Historical Provider, MD  metoprolol succinate (TOPROL-XL) 25 MG 24 hr tablet Take 25 mg by mouth daily.  09/17/13  Yes Historical Provider, MD  Multiple Vitamin (MULTIVITAMIN WITH MINERALS) TABS tablet Take 1 tablet by mouth daily.   Yes Historical Provider, MD  PARoxetine (PAXIL) 40 MG tablet Take 40 mg by mouth daily.  09/17/13  Yes Historical Provider, MD  ranitidine (ZANTAC) 150 MG tablet Take 150 mg by mouth daily.  08/16/13  Yes Historical Provider, MD  simvastatin (ZOCOR) 40 MG tablet Take 40 mg by mouth daily.  09/17/13  Yes Historical Provider, MD  traMADol (ULTRAM) 50 MG tablet Take 50 mg by mouth every 6 (six) hours as needed for moderate pain.   Yes Historical Provider, MD  valsartan-hydrochlorothiazide (DIOVAN-HCT) 160-25 MG per tablet Take 1 tablet by  mouth daily.  09/17/13  Yes Historical Provider, MD  vitamin B-12 (CYANOCOBALAMIN) 1000 MCG tablet Take 1,000 mcg by mouth daily.   Yes Historical Provider, MD  amoxicillin (AMOXIL) 500 MG capsule Take 1 capsule (500 mg total) by mouth 3 (three) times daily. Patient not taking: Reported on 12/04/2015 10/20/13   Janne Napoleon, NP  HYDROcodone-acetaminophen (NORCO) 5-325 MG per tablet Take 1-2 tablets by mouth every 6 (six) hours as needed. Patient not taking:  Reported on 12/04/2015 04/29/15   Paula Libra, MD  methylPREDNISolone (MEDROL DOSEPAK) 4 MG TBPK tablet Take tapering dose per package instructions. Patient not taking: Reported on 12/04/2015 04/29/15   Paula Libra, MD   BP 125/82 mmHg  Pulse 83  Temp(Src) 98.2 F (36.8 C) (Oral)  Resp 20  Ht  (1.702 m)  Wt 97.07 kg  BMI 33.51 kg/m2  SpO2 99% Physical Exam  Constitutional: She is oriented to person, place, and time. She appears well-developed and well-nourished.  HENT:  Head: Normocephalic and atraumatic.  Right Ear: External ear normal.  Nose: Nose normal.  Mouth/Throat: Oropharynx is clear and moist.  Tender occipital scalp   Eyes: Conjunctivae and EOM are normal. Pupils are equal, round, and reactive to light.  Neck: Normal range of motion.  Cardiovascular: Normal rate and normal heart sounds.   Pulmonary/Chest: Effort normal.  Abdominal: She exhibits no distension.  Musculoskeletal: She exhibits tenderness.  Tender right wrist, tender right hand.  Pain with range of motion,  nv and ns intact  Neurological: She is alert and oriented to person, place, and time.  Skin: Skin is warm. There is erythema.  Psychiatric: She has a normal mood and affect.  Nursing note and vitals reviewed.   ED Course  Procedures (including critical care time) Labs Review Labs Reviewed - No data to display  Imaging Review Dg Wrist Complete Left  12/04/2015  CLINICAL DATA:  Fall with left wrist injury.  Initial encounter. EXAM: LEFT WRIST - COMPLETE 3+ VIEW COMPARISON:  None. FINDINGS: Tiny bone fragment at the radial styloid which has a chronic corticated appearance. There is no dislocation or definitive fracture. IMPRESSION: Tiny bone fragment at the radial styloid has a chronic appearance, but if associated with tenderness could instead reflect small avulsion fracture. Electronically Signed   By: Marnee Spring M.D.   On: 12/04/2015 23:51   Dg Hand Complete Left  12/04/2015  CLINICAL DATA:  Fall  with left hand injury.  Initial encounter. EXAM: LEFT HAND - COMPLETE 3+ VIEW COMPARISON:  None. FINDINGS: There is a small bone fragment at the radial styloid which appears corticated and without definite donor site. No definitive fracture. No dislocation. Early interphalangeal narrowing and spurring. IMPRESSION: Tiny bone fragment at the radial styloid has a chronic appearance, but if focally tender could instead reflect an avulsion fracture. Electronically Signed   By: Marnee Spring M.D.   On: 12/04/2015 23:50   I have personally reviewed and evaluated these images and lab results as part of my medical decision-making.   EKG Interpretation None      MDM   Final diagnoses:  Radial styloid fracture, left, closed, initial encounter  Contusion of head, initial encounter    velcro wrist splint    Elson Areas, PA-C 12/05/15 0104  Lonia Skinner [place], PA-C 12/05/15 1610  Azalia Bilis, MD 12/05/15 [phone]

## 2015-12-05 NOTE — Discharge Instructions (Signed)
Contusion A contusion is a deep bruise. Contusions are the result of a blunt injury to tissues and muscle fibers under the skin. The injury causes bleeding under the skin. The skin overlying the contusion may turn blue, purple, or yellow. Minor injuries will give you a painless contusion, but more severe contusions may stay painful and swollen for a few weeks.  CAUSES  This condition is usually caused by a blow, trauma, or direct force to an area of the body. SYMPTOMS  Symptoms of this condition include:  Swelling of the injured area.  Pain and tenderness in the injured area.  Discoloration. The area may have redness and then turn blue, purple, or yellow. DIAGNOSIS  This condition is diagnosed based on a physical exam and medical history. An X-ray, CT scan, or MRI may be needed to determine if there are any associated injuries, such as broken bones (fractures). TREATMENT  Specific treatment for this condition depends on what area of the body was injured. In general, the best treatment for a contusion is resting, icing, applying pressure to (compression), and elevating the injured area. This is often called the RICE strategy. Over-the-counter anti-inflammatory medicines may also be recommended for pain control.  HOME CARE INSTRUCTIONS   Rest the injured area.  If directed, apply ice to the injured area:  Put ice in a plastic bag.  Place a towel between your skin and the bag.  Leave the ice on for 20 minutes, 2-3 times per day.  If directed, apply light compression to the injured area using an elastic bandage. Make sure the bandage is not wrapped too tightly. Remove and reapply the bandage as directed by your health care provider.  If possible, raise (elevate) the injured area above the level of your heart while you are sitting or lying down.  Take over-the-counter and prescription medicines only as told by your health care provider. SEEK MEDICAL CARE IF:  Your symptoms do not  improve after several days of treatment.  Your symptoms get worse.  You have difficulty moving the injured area. SEEK IMMEDIATE MEDICAL CARE IF:   You have severe pain.  You have numbness in a hand or foot.  Your hand or foot turns pale or cold.   This information is not intended to replace advice given to you by your health care provider. Make sure you discuss any questions you have with your health care provider.   Document Released: 07/28/2005 Document Revised: 07/09/2015 Document Reviewed: 03/05/2015 Elsevier Interactive Patient Education 2016 [employer] Wrist Fracture A wrist fracture is a break or crack in one of the bones of your wrist. Your wrist is made up of eight small bones at the palm of your hand (carpal bones) and two long bones that make up your forearm (radius and ulna). CAUSES  A direct blow to the wrist.  Falling on an outstretched hand.  Trauma, such as a car accident or a fall. RISK FACTORS Risk factors for wrist fracture include:  Participating in contact and high-risk sports, such as skiing, biking, and ice skating.  Taking steroid medicines.  Smoking.  Being female.  Being Caucasian.  Drinking more than three alcoholic beverages per day.  Having low or lowered bone density (osteoporosis or osteopenia).  Age. Older adults have decreased bone density.  Women who have had menopause.  History of previous fractures. SIGNS AND SYMPTOMS Symptoms of wrist fractures include tenderness, bruising, and inflammation. Additionally, the wrist may hang in an odd position or appear deformed. DIAGNOSIS  Diagnosis may include:  Physical exam.  X-ray. TREATMENT Treatment depends on many factors, including the nature and location of the fracture, your age, and your activity level. Treatment for wrist fracture can be nonsurgical or surgical. Nonsurgical Treatment A plaster cast or splint may be applied to your wrist if the bone is in a good position. If  the fracture is not in good position, it may be necessary for your health care provider to realign it before applying a splint or cast. Usually, a cast or splint will be worn for several weeks. Surgical Treatment Sometimes the position of the bone is so far out of place that surgery is required to apply a device to hold it together as it heals. Depending on the fracture, there are a number of options for holding the bone in place while it heals, such as a cast and metal pins. HOME CARE INSTRUCTIONS  Keep your injured wrist elevated and move your fingers as much as possible.  Do not put pressure on any part of your cast or splint. It may break.  Use a plastic bag to protect your cast or splint from water while bathing or showering. Do not lower your cast or splint into water.  Take medicines only as directed by your health care provider.  Keep your cast or splint clean and dry. If it becomes wet, damaged, or suddenly feels too tight, contact your health care provider right away.  Do not use any tobacco products including cigarettes, chewing tobacco, or electronic cigarettes. Tobacco can delay bone healing. If you need help quitting, ask your health care provider.  Keep all follow-up visits as directed by your health care provider. This is important.  Ask your health care provider if you should take supplements of calcium and vitamins C and D to promote bone healing. SEEK MEDICAL CARE IF:  Your cast or splint is damaged, breaks, or gets wet.  You have a fever.  You have chills.  You have continued severe pain or more swelling than you did before the cast was put on. SEEK IMMEDIATE MEDICAL CARE IF:  Your hand or fingernails on the injured arm turn blue or gray, or feel cold or numb.  You have decreased feeling in the fingers of your injured arm. MAKE SURE YOU:  Understand these instructions.  Will watch your condition.  Will get help right away if you are not doing well or get  worse.   This information is not intended to replace advice given to you by your health care provider. Make sure you discuss any questions you have with your health care provider.   Document Released: 07/28/2005 Document Revised: 07/09/2015 Document Reviewed: 11/05/2011 Elsevier Interactive Patient Education [employer]

## 2015-12-05 NOTE — ED Notes (Signed)
Discharge instructions, follow up care, and rx x1 reviewed with patient. Patient verbalized understanding. 

## 2016-03-05 IMAGING — CT CT HEAD W/O CM
2 series · 17 of 30 positions shown, 20 images · non-contrast
Comparison: None.

CLINICAL DATA: Fall. Fall downstairs after pulled by dog, striking
head on concrete.

EXAM:
CT HEAD WITHOUT CONTRAST
TECHNIQUE: Contiguous axial images were obtained from the base of the skull
through the vertex without intravenous contrast.

[Series 2: head w/o · axial · non-contrast · 0.44mm/px · z∈[+1466,+1586]mm · 9 of 32 slices shown, 12 images]
[im 4/32  brain]
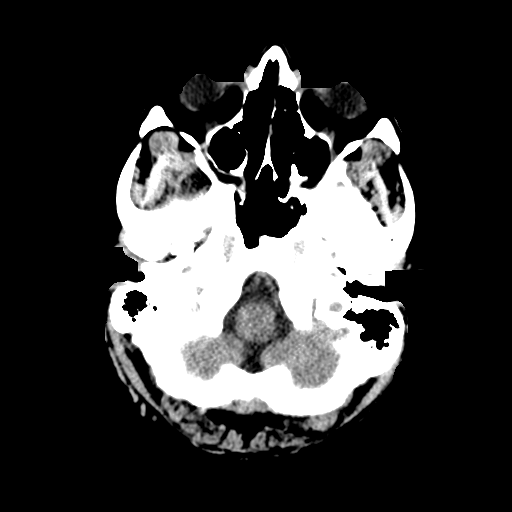
[im 4/32  bone]
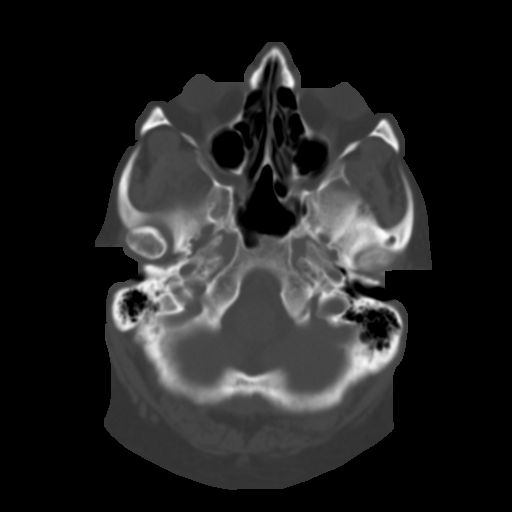
[im 7/32  brain]
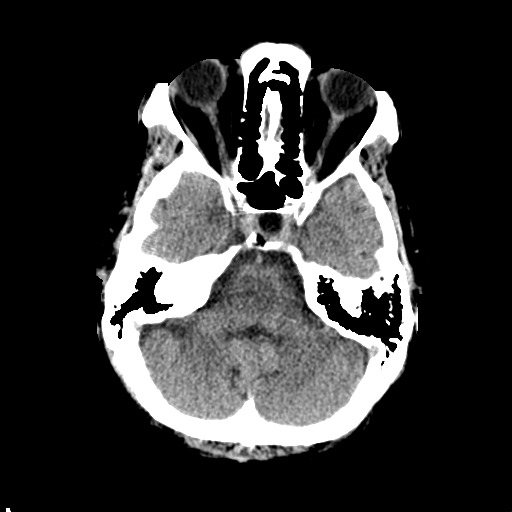
[im 10/32  brain]
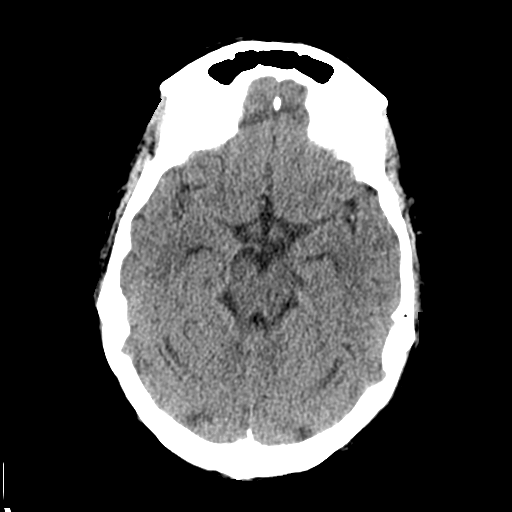
[im 13/32  brain]
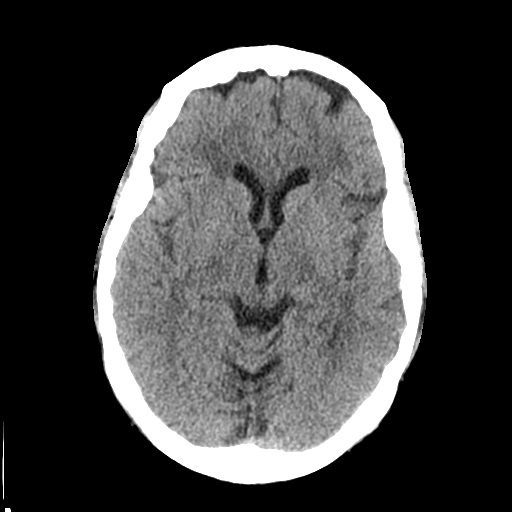
[im 16/32  brain]
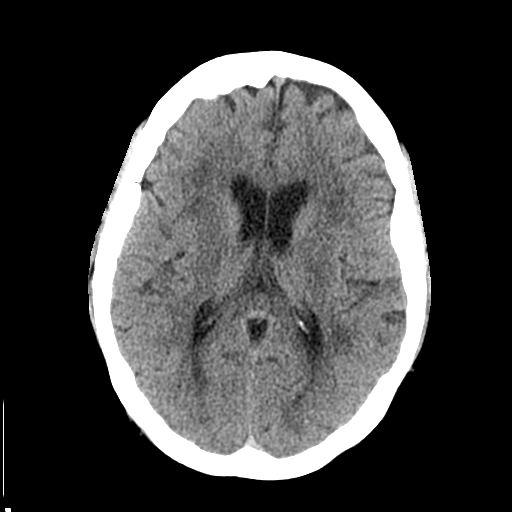
[im 16/32  bone]
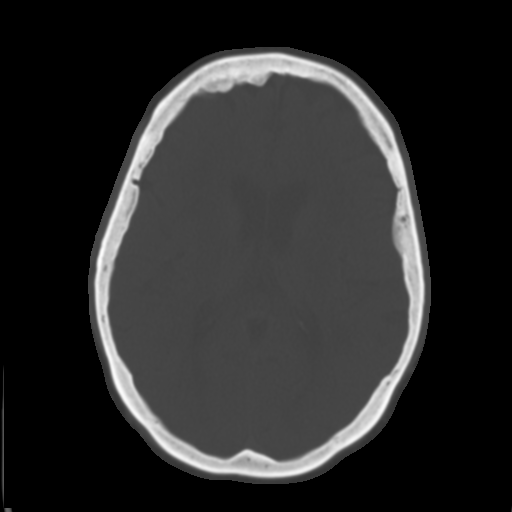
[im 19/32  brain]
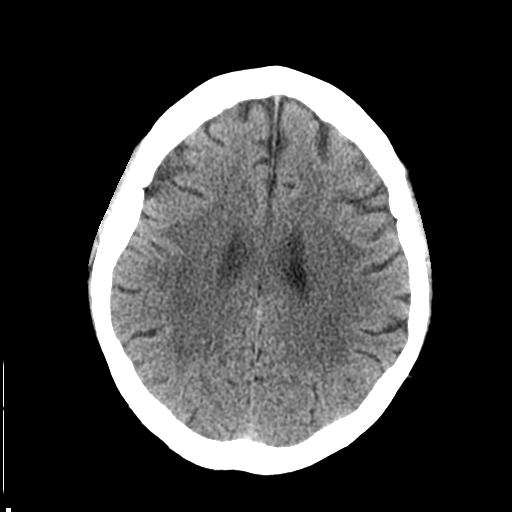
[im 22/32  brain]
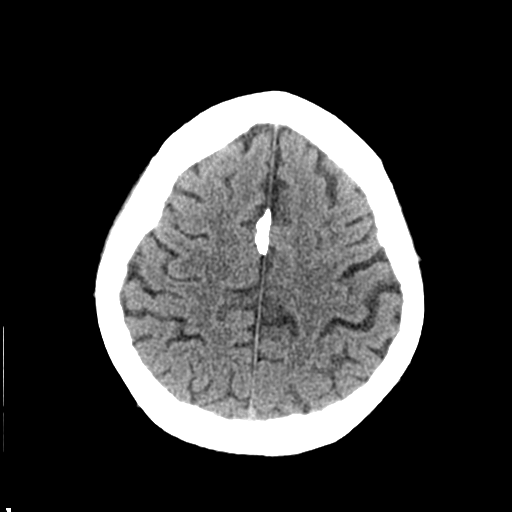
[im 25/32  brain]
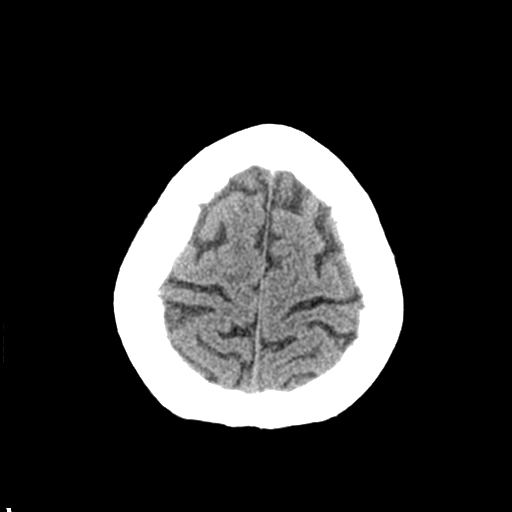
[im 28/32  brain]
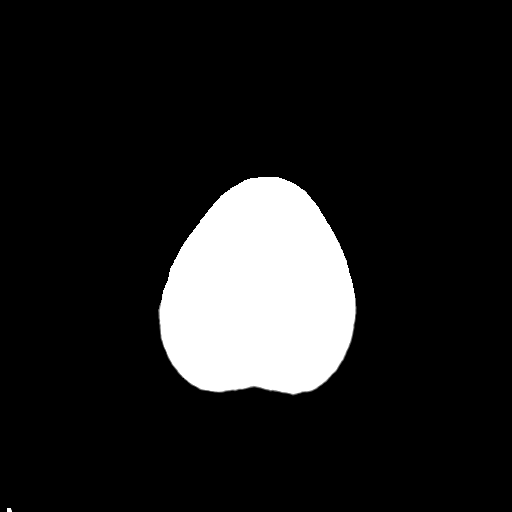
[im 28/32  bone]
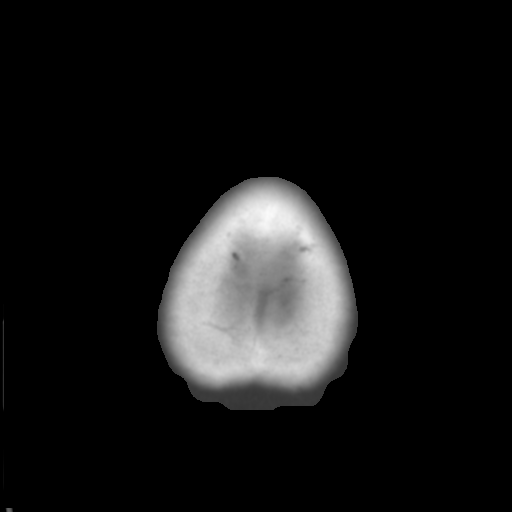

[Series 3: bone windows · axial · 0.44mm/px · z∈[+1466,+1589]mm · 8 of 53 slices shown]
[im 6/53  bone]
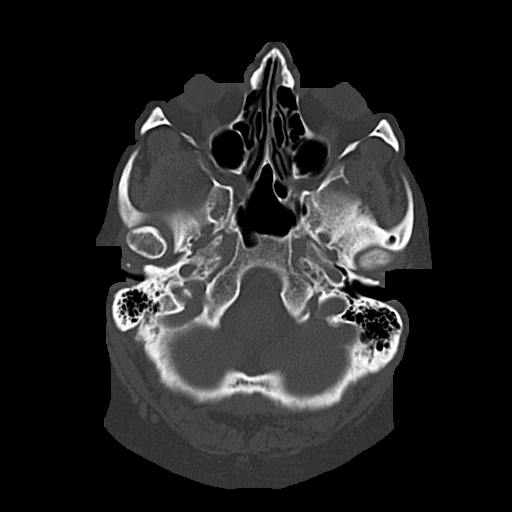
[im 12/53  bone]
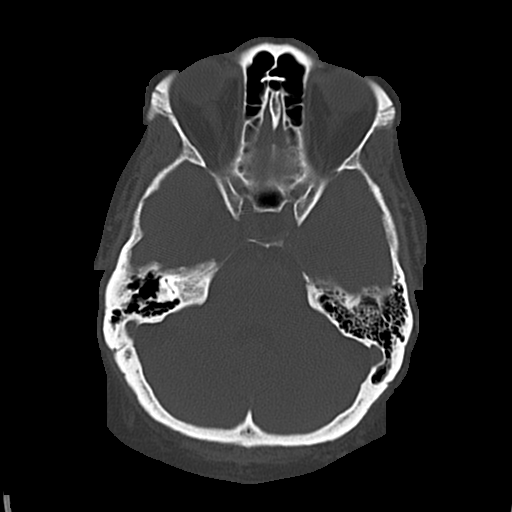
[im 18/53  bone]
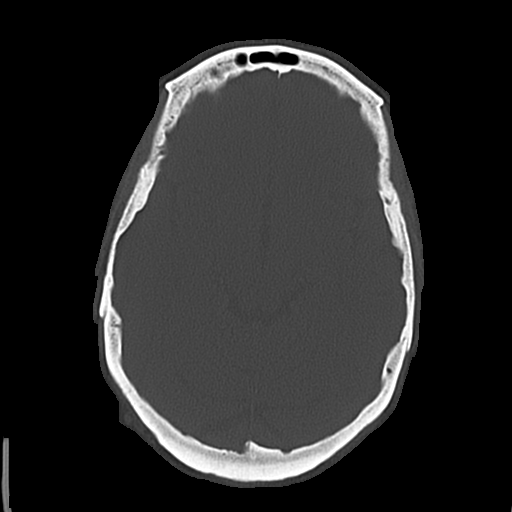
[im 24/53  bone]
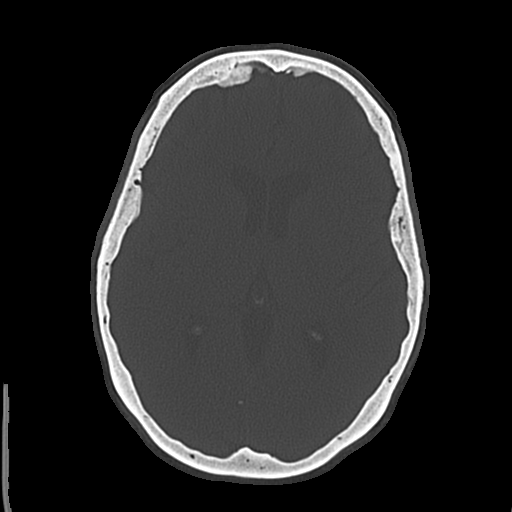
[im 29/53  bone]
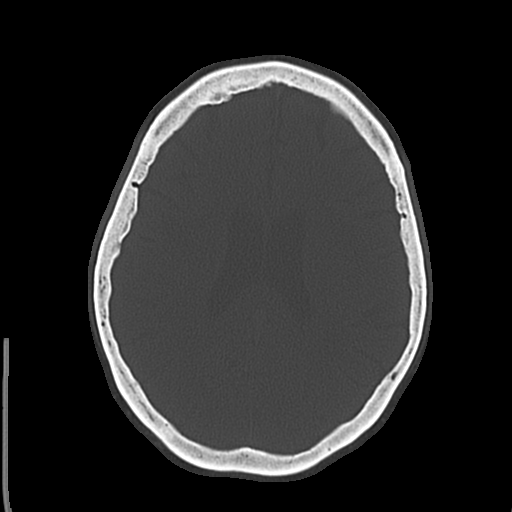
[im 35/53  bone]
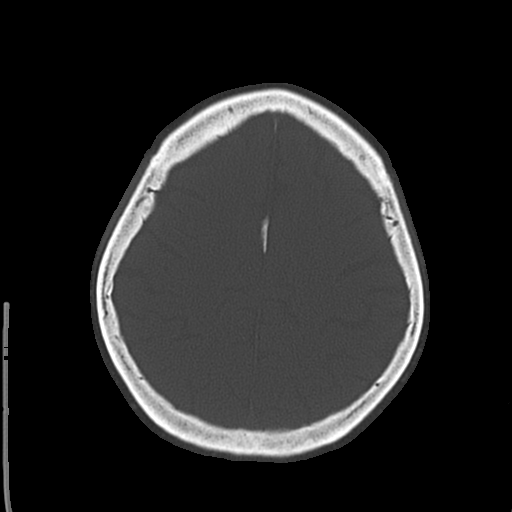
[im 41/53  bone]
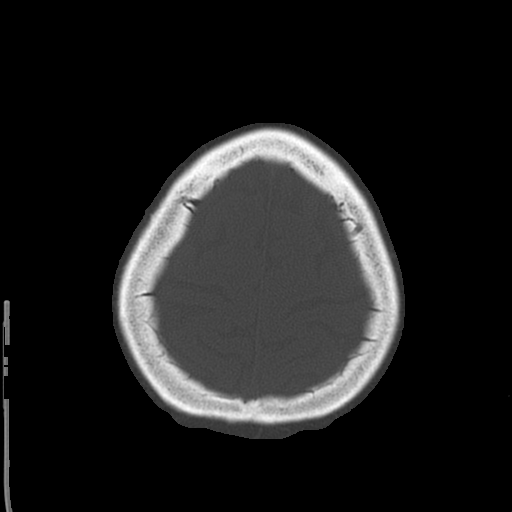
[im 47/53  bone]
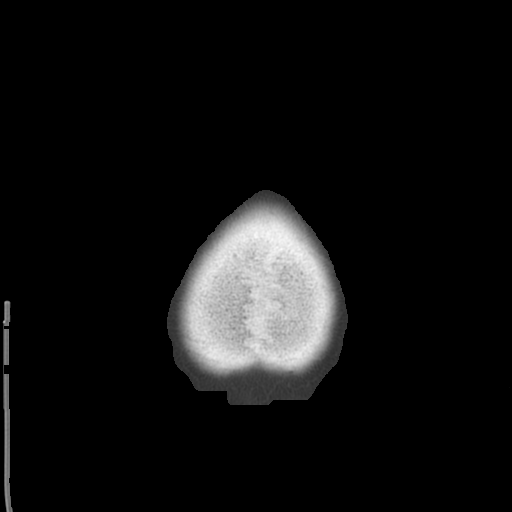

[17 of 30 positions shown; findings below may reference images not displayed]

FINDINGS: No intracranial hemorrhage, mass effect, or midline shift. No
hydrocephalus. The basilar cisterns are patent. No evidence of
territorial infarct. No intracranial fluid collection. Calvarium is
intact. Included paranasal sinuses and mastoid air cells are well
aerated.
IMPRESSION: No acute intracranial abnormality.
